# Patient Record
Sex: Female | Born: 1955
Health system: Southern US, Community
[De-identification: ages and names within clinical notes are randomized; demographics above are authoritative.]

## PROBLEM LIST (undated history)

## (undated) DIAGNOSIS — F32A Depression, unspecified: Secondary | ICD-10-CM

## (undated) DIAGNOSIS — F419 Anxiety disorder, unspecified: Secondary | ICD-10-CM

## (undated) DIAGNOSIS — T7840XA Allergy, unspecified, initial encounter: Secondary | ICD-10-CM

## (undated) DIAGNOSIS — Z780 Asymptomatic menopausal state: Secondary | ICD-10-CM

## (undated) DIAGNOSIS — F329 Major depressive disorder, single episode, unspecified: Secondary | ICD-10-CM

## (undated) DIAGNOSIS — G43909 Migraine, unspecified, not intractable, without status migrainosus: Secondary | ICD-10-CM

## (undated) DIAGNOSIS — E079 Disorder of thyroid, unspecified: Secondary | ICD-10-CM

## (undated) HISTORY — DX: Asymptomatic menopausal state: Z78.0

## (undated) HISTORY — DX: Anxiety disorder, unspecified: F41.9

## (undated) HISTORY — DX: Migraine, unspecified, not intractable, without status migrainosus: G43.909

## (undated) HISTORY — PX: BREAST SURGERY: SHX581

## (undated) HISTORY — DX: Allergy, unspecified, initial encounter: T78.40XA

## (undated) HISTORY — DX: Depression, unspecified: F32.A

## (undated) HISTORY — DX: Major depressive disorder, single episode, unspecified: F32.9

---

## 1991-06-20 HISTORY — PX: TUBAL LIGATION: SHX77

## 1998-01-14 ENCOUNTER — Other Ambulatory Visit: Admission: RE | Admit: 1998-01-14 | Discharge: 1998-01-14 | Payer: Self-pay | Admitting: Gynecology

## 1998-05-25 ENCOUNTER — Ambulatory Visit (HOSPITAL_COMMUNITY): Admission: RE | Admit: 1998-05-25 | Discharge: 1998-05-25 | Payer: Self-pay

## 1999-02-24 ENCOUNTER — Other Ambulatory Visit: Admission: RE | Admit: 1999-02-24 | Discharge: 1999-02-24 | Payer: Self-pay | Admitting: Gynecology

## 2001-10-14 ENCOUNTER — Other Ambulatory Visit: Admission: RE | Admit: 2001-10-14 | Discharge: 2001-10-14 | Payer: Self-pay | Admitting: Obstetrics and Gynecology

## 2002-06-19 DIAGNOSIS — Z780 Asymptomatic menopausal state: Secondary | ICD-10-CM

## 2002-06-19 HISTORY — DX: Asymptomatic menopausal state: Z78.0

## 2003-08-13 ENCOUNTER — Other Ambulatory Visit: Admission: RE | Admit: 2003-08-13 | Discharge: 2003-08-13 | Payer: Self-pay | Admitting: Obstetrics and Gynecology

## 2004-08-25 ENCOUNTER — Other Ambulatory Visit: Admission: RE | Admit: 2004-08-25 | Discharge: 2004-08-25 | Payer: Self-pay | Admitting: Obstetrics and Gynecology

## 2005-03-02 ENCOUNTER — Ambulatory Visit (HOSPITAL_BASED_OUTPATIENT_CLINIC_OR_DEPARTMENT_OTHER): Admission: RE | Admit: 2005-03-02 | Discharge: 2005-03-02 | Payer: Self-pay | Admitting: Plastic Surgery

## 2005-03-02 ENCOUNTER — Ambulatory Visit (HOSPITAL_COMMUNITY): Admission: RE | Admit: 2005-03-02 | Discharge: 2005-03-02 | Payer: Self-pay | Admitting: Plastic Surgery

## 2006-06-19 DIAGNOSIS — G43909 Migraine, unspecified, not intractable, without status migrainosus: Secondary | ICD-10-CM

## 2006-06-19 HISTORY — DX: Migraine, unspecified, not intractable, without status migrainosus: G43.909

## 2011-06-22 ENCOUNTER — Ambulatory Visit: Payer: Self-pay

## 2011-06-22 DIAGNOSIS — G43709 Chronic migraine without aura, not intractable, without status migrainosus: Secondary | ICD-10-CM

## 2011-08-03 ENCOUNTER — Telehealth: Payer: Self-pay

## 2011-08-03 NOTE — Telephone Encounter (Signed)
Pt states saw dr Hal Hope for migraines. States the rx for her is not working - she feels the dose is not strong enough or the type is not working. States she can't afford to rtc currently and would like to know if the rx can be changed in some way to help her.  Best: [phone]   [facility] [address]

## 2011-08-04 NOTE — Telephone Encounter (Signed)
Patient was given on June 22, 2011- Imitrex 50mg  1 po prn migraine, but not helping.

## 2011-08-04 NOTE — Telephone Encounter (Signed)
Explained provider message to pt who stated she can't afford a CT and is not having persistent HAs. Doesn't feel that she should have to do that to try a different medication. Gave pt information about [place] financial aid, but pt didn't want the #, stating she was very disappointed with [place] and would try to get other meds from former MD.

## 2011-08-04 NOTE — Telephone Encounter (Signed)
Needs to RTC for eval.  Pt was encouraged to have CT at last OV for headache.  If pain is persisting despite medication needs to RTC for eval and treatment and possibly RF for CT.

## 2011-11-27 ENCOUNTER — Ambulatory Visit: Payer: Self-pay | Admitting: Physician Assistant

## 2011-11-27 VITALS — BP 122/78 | HR 73 | Temp 98.0°F | Resp 16 | Ht 63.5 in | Wt 119.8 lb

## 2011-11-27 DIAGNOSIS — L821 Other seborrheic keratosis: Secondary | ICD-10-CM

## 2011-11-27 NOTE — Progress Notes (Signed)
  Subjective:    Patient ID: Debra Hunter, female    DOB: 15-Dec-1955, 56 y.o.   MRN: [id number]  HPI  Presents for evaluation of a lesion on her back that she just noticed today.  No pain.  Review of Systems     Objective:   Physical Exam  4 mm x 6 mm hyperpigmented, hyperkeratotic "stuck-on" appearing plaque left mid-back.  Several smaller similar lesions noted scattered on the back.      Assessment & Plan:   1. Seborrheic keratosis    Re-assurance.  Encouraged to obtain health maintenance.  Needs breast exam, mammogram, and pap. Patient is uninsured.  She will see what arrangements she can make.

## 2011-11-27 NOTE — Patient Instructions (Signed)
[facility] Financial Aid Office [phone].  If you have difficulties, call us [phone]) and ask for Okey Regal at ext [phone]

## 2012-04-12 ENCOUNTER — Ambulatory Visit: Payer: Self-pay | Admitting: Family Medicine

## 2012-04-12 VITALS — BP 116/80 | HR 69 | Temp 97.7°F | Resp 16 | Ht 65.0 in | Wt 118.4 lb

## 2012-04-12 DIAGNOSIS — G43909 Migraine, unspecified, not intractable, without status migrainosus: Secondary | ICD-10-CM

## 2012-04-12 DIAGNOSIS — G43709 Chronic migraine without aura, not intractable, without status migrainosus: Secondary | ICD-10-CM

## 2012-04-12 MED ORDER — SUMATRIPTAN SUCCINATE 100 MG PO TABS: 100.0000 mg | ORAL_TABLET | ORAL | Status: AC | PRN

## 2012-04-12 NOTE — Assessment & Plan Note (Signed)
New to this provider.  Counseled extensively during visit regarding prevention, triggers.  Rx for Imitrex 100mg  provided and counseled on use. Advised to also continue Magnesium supplementation. Normal neurological exam; no concerning features to suggest other etiology.  Symptoms classic for migraine.

## 2012-04-12 NOTE — Progress Notes (Signed)
[address]   [place], [place]  [address]   [phone]  Subjective:    Patient ID: Debra Hunter, female    DOB: 1955-09-11, 56 y.o.   MRN: [id number]  HPIThis 56 y.o. female presents for evaluation of migraine headaches. Has been taking friend's medication; has taken friend's Imitrex, Maxalt.  Onset of migraines three years ago with menopause.  LMP age 54.  Always the same situation; occurs with major stressful situations.  Will start medicating with Ibuprofen 800mg , 3 sudafeds with relief sometimes; sometimes it will not help.  Draining headache.HA is different a lot of times.  Bends down a lot with work; location occipital region.  If does radiate to frontal region, usually is unilateral.  Pressure sensation in back.  Stretching neck and massage helps.  +nausea severe but rare vomiting.  +photophobia; +phonophobia. Usually must go to bed.  Must leave work and shut down.  Severity 8/10.  Has 1 per month.  Missing a lot of work.  If drinks any alcohol especially wine.   Beer rarely has effect.  No blurred vision, diplopia.  No dizziness; no n/t/w.  No unsteadiness.  No nighttime awakening.  No chronic neck pain.  Sister with migraines menstrually related.  Has taken Imitrex a lot with improvement; works within 1/2 hour.  Maxalt even more effective.  Found a drug rep who gave sample of Maxalt MLT.  No exertional headaches; not related to running.  No headaches after running.  Regular sleep schedule a bit disrupted in past few years.  No coital headaches.  PMH: none Psurg:see list Social:  Works a lot; works for a [employer] houses also; no tobacco; no drugs.  Alcohol rarely now due to headaches.  Exercises a lot 4 days per week 4 miles.   Review of Systems  Constitutional: Negative for fever, chills, diaphoresis, activity change, appetite change and fatigue.  HENT: Negative for nosebleeds, congestion, rhinorrhea, sneezing and postnasal drip.   Eyes: Positive for photophobia. Negative  for visual disturbance.  Respiratory: Negative for cough, shortness of breath, wheezing and stridor.   Cardiovascular: Negative for chest pain, palpitations and leg swelling.  Neurological: Positive for headaches. Negative for dizziness, tremors, seizures, syncope, facial asymmetry, speech difficulty, weakness, light-headedness and numbness.  Psychiatric/Behavioral: Positive for disturbed wake/sleep cycle. Negative for hallucinations, dysphoric mood and decreased concentration. The patient is not nervous/anxious and is not hyperactive.         Past Medical History  Diagnosis Date  . Allergy     PAST, PER PT DOES NOT HAVE ANYMORE  . Depression   . Anxiety   . Menopause 06/19/2002    age 79.  . Migraine headache 06/19/2006    One headache monthly.  One sided, nausea, photophobia.    Past Surgical History  Procedure Date  . Breast surgery 13 YRS AGO    LEFT REMOVE CYST  . Cesarean section 1993  . Tubal ligation 1993    AFTER DELIVERY    Prior to Admission medications   Medication Sig Start Date End Date Taking? Authorizing Provider  b complex vitamins tablet Take 1 tablet by mouth daily.   Yes Historical Provider, MD  Cholecalciferol (VITAMIN D) 1000 UNITS capsule Take 1,000 Units by mouth daily.   Yes Historical Provider, MD  Providence Lanius CAPS Take by mouth daily.   Yes Historical Provider, MD  Magnesium 500 MG TABS Take 500 mg by mouth daily.   Yes Historical Provider, MD  selenium 50 MCG  TABS Take 50 mcg by mouth daily.   Yes Historical Provider, MD  VITAMIN E PO Take by mouth as needed.   Yes Historical Provider, MD  SUMAtriptan (IMITREX) 100 MG tablet Take 1 tablet (100 mg total) by mouth every 2 (two) hours as needed for migraine. Do not exceed two tablets in 24 hour period. 04/12/12   Ethelda Chick, MD    No Known Allergies  History   Social History  . Marital Status: Married    Spouse Name: N/A    Number of Children: N/A  . Years of Education: 4   Occupational  History  . CATERER    Social History Main Topics  . Smoking status: Never Smoker   . Smokeless tobacco: Never Used  . Alcohol Use: 3.6 oz/week    6 Glasses of wine per week     WINE 1-2 GLASSES/WEEK  . Drug Use: No  . Sexually Active: Yes     HAD TUBAL LIGATION, SEX PARTNERS 1 IN 12 MONTHS   Other Topics Concern  . Not on file   Social History Narrative   Marital status: divorced.   Children: 1 daughter, 1 son.   Employment: works for Sales promotion account executive; also [employer].   Tobacco: never   Alcohol: 2 glasses of wine or beer three times per week.   Drugs: none   EXERCISE - RUN 4 TIMES/WEEK 4 MILES FOR 35 MINUTES   Sexual activity: sexually active; one partner in past year.    Family History  Problem Relation Age of Onset  . COPD Mother 75  . Heart disease Father     Objective:   Physical Exam  Nursing note and vitals reviewed. Constitutional: She is oriented to person, place, and time. She appears well-developed and well-nourished. No distress.  HENT:  Head: Normocephalic and atraumatic.  Right Ear: External ear normal.  Left Ear: External ear normal.  Nose: Nose normal.  Mouth/Throat: Oropharynx is clear and moist.  Eyes: Conjunctivae normal and EOM are normal. Pupils are equal, round, and reactive to light.  Neck: Normal range of motion. Neck supple. No thyromegaly present.  Cardiovascular: Normal rate, regular rhythm, normal heart sounds and intact distal pulses.  Exam reveals no gallop and no friction rub.   No murmur heard. Pulmonary/Chest: Effort normal and breath sounds normal. No respiratory distress. She has no wheezes. She has no rales.  Musculoskeletal:       Cervical back: She exhibits normal range of motion, no tenderness and no bony tenderness.  Lymphadenopathy:    She has no cervical adenopathy.  Neurological: She is alert and oriented to person, place, and time. She has normal reflexes. No cranial nerve deficit. She exhibits normal muscle tone. Coordination  normal.  Skin: Skin is warm and dry. She is not diaphoretic.  Psychiatric: She has a normal mood and affect. Her behavior is normal. Judgment and thought content normal.       Assessment & Plan:   1. Migraine headache  SUMAtriptan (IMITREX) 100 MG tablet   Meds ordered this encounter  Medications  . selenium 50 MCG TABS    Sig: Take 50 mcg by mouth daily.  . Cholecalciferol (VITAMIN D) 1000 UNITS capsule    Sig: Take 1,000 Units by mouth daily.  . Magnesium 500 MG TABS    Sig: Take 500 mg by mouth daily.  Providence Lanius CAPS    Sig: Take by mouth daily.  Marland Kitchen b complex vitamins tablet    Sig:  Take 1 tablet by mouth daily.  Marland Kitchen VITAMIN E PO    Sig: Take by mouth as needed.  . SUMAtriptan (IMITREX) 100 MG tablet    Sig: Take 1 tablet (100 mg total) by mouth every 2 (two) hours as needed for migraine. Do not exceed two tablets in 24 hour period.    Dispense:  8 tablet    Refill:  3

## 2012-04-12 NOTE — Patient Instructions (Signed)
1. Migraine headache  SUMAtriptan (IMITREX) 100 MG tablet   Migraine Headache A migraine headache is an intense, throbbing pain on one or both sides of your head. A migraine can last for 30 minutes to several hours. CAUSES  The exact cause of a migraine headache is not always known. However, a migraine may be caused when nerves in the brain become irritated and release chemicals that cause inflammation. This causes pain. SYMPTOMS  Pain on one or both sides of your head.  Pulsating or throbbing pain.  Severe pain that prevents daily activities.  Pain that is aggravated by any physical activity.  Nausea, vomiting, or both.  Dizziness.  Pain with exposure to bright lights, loud noises, or activity.  General sensitivity to bright lights, loud noises, or smells. Before you get a migraine, you may get warning signs that a migraine is coming (aura). An aura may include:  Seeing flashing lights.  Seeing bright spots, halos, or zig-zag lines.  Having tunnel vision or blurred vision.  Having feelings of numbness or tingling.  Having trouble talking.  Having muscle weakness. MIGRAINE TRIGGERS  Alcohol.  Smoking.  Stress.  Menstruation.  Aged cheeses.  Foods or drinks that contain nitrates, glutamate, aspartame, or tyramine.  Lack of sleep.  Chocolate.  Caffeine.  Hunger.  Physical exertion.  Fatigue.  Medicines used to treat chest pain (nitroglycerine), birth control pills, estrogen, and some blood pressure medicines. DIAGNOSIS  A migraine headache is often diagnosed based on:  Symptoms.  Physical examination.  A CT scan or MRI of your head. TREATMENT Medicines may be given for pain and nausea. Medicines can also be given to help prevent recurrent migraines.  HOME CARE INSTRUCTIONS  Only take over-the-counter or prescription medicines for pain or discomfort as directed by your caregiver. The use of long-term narcotics is not recommended.  Lie down in  a dark, quiet room when you have a migraine.  Keep a journal to find out what may trigger your migraine headaches. For example, write down:  What you eat and drink.  How much sleep you get.  Any change to your diet or medicines.  Limit alcohol consumption.  Quit smoking if you smoke.  Get 7 to 9 hours of sleep, or as recommended by your caregiver.  Limit stress.  Keep lights dim if bright lights bother you and make your migraines worse. SEEK IMMEDIATE MEDICAL CARE IF:   Your migraine becomes severe.  You have a fever.  You have a stiff neck.  You have vision loss.  You have muscular weakness or loss of muscle control.  You start losing your balance or have trouble walking.  You feel faint or pass out.  You have severe symptoms that are different from your first symptoms. MAKE SURE YOU:   Understand these instructions.  Will watch your condition.  Will get help right away if you are not doing well or get worse. Document Released: 06/05/2005 Document Revised: 08/28/2011 Document Reviewed: 05/26/2011 [facility] Patient Information 2013 [place], [place].

## 2012-09-26 ENCOUNTER — Other Ambulatory Visit: Payer: Self-pay | Admitting: Obstetrics and Gynecology

## 2013-12-08 ENCOUNTER — Other Ambulatory Visit: Payer: Self-pay | Admitting: Obstetrics and Gynecology

## 2013-12-09 LAB — CYTOLOGY - PAP

## 2014-04-22 ENCOUNTER — Telehealth: Payer: Self-pay | Admitting: *Deleted

## 2014-04-22 NOTE — Telephone Encounter (Signed)
Received medical records via fax from [facility]. Records forwarded to [facility]. JG//[id number]

## 2014-10-15 ENCOUNTER — Telehealth: Payer: Self-pay | Admitting: *Deleted

## 2014-10-15 NOTE — Telephone Encounter (Signed)
Spoke with pt regarding her pre visit. Pt states did not know she was scheduled to see Dr.Blyth nor wanting to be seen at this practice.Marland KitchenMarland Kitchen

## 2014-10-16 ENCOUNTER — Ambulatory Visit: Payer: Self-pay | Admitting: Family Medicine

## 2015-01-18 ENCOUNTER — Other Ambulatory Visit: Payer: Self-pay | Admitting: Obstetrics and Gynecology

## 2015-01-19 LAB — CYTOLOGY - PAP

## 2015-10-21 DIAGNOSIS — F900 Attention-deficit hyperactivity disorder, predominantly inattentive type: Secondary | ICD-10-CM | POA: Diagnosis not present

## 2015-10-21 DIAGNOSIS — G43909 Migraine, unspecified, not intractable, without status migrainosus: Secondary | ICD-10-CM | POA: Diagnosis not present

## 2015-10-21 DIAGNOSIS — E039 Hypothyroidism, unspecified: Secondary | ICD-10-CM | POA: Diagnosis not present

## 2016-03-27 DIAGNOSIS — Z01419 Encounter for gynecological examination (general) (routine) without abnormal findings: Secondary | ICD-10-CM | POA: Diagnosis not present

## 2016-03-27 DIAGNOSIS — Z6820 Body mass index (BMI) 20.0-20.9, adult: Secondary | ICD-10-CM | POA: Diagnosis not present

## 2016-03-27 DIAGNOSIS — Z1231 Encounter for screening mammogram for malignant neoplasm of breast: Secondary | ICD-10-CM | POA: Diagnosis not present

## 2016-03-27 DIAGNOSIS — Z1212 Encounter for screening for malignant neoplasm of rectum: Secondary | ICD-10-CM | POA: Diagnosis not present

## 2016-05-18 DIAGNOSIS — J069 Acute upper respiratory infection, unspecified: Secondary | ICD-10-CM | POA: Diagnosis not present

## 2016-05-18 DIAGNOSIS — R05 Cough: Secondary | ICD-10-CM | POA: Diagnosis not present

## 2016-05-18 DIAGNOSIS — R0789 Other chest pain: Secondary | ICD-10-CM | POA: Diagnosis not present

## 2016-05-25 DIAGNOSIS — Z131 Encounter for screening for diabetes mellitus: Secondary | ICD-10-CM | POA: Diagnosis not present

## 2016-05-25 DIAGNOSIS — Z118 Encounter for screening for other infectious and parasitic diseases: Secondary | ICD-10-CM | POA: Diagnosis not present

## 2016-05-25 DIAGNOSIS — Z130 Encounter for screening for diseases of the blood and blood-forming organs and certain disorders involving the immune mechanism: Secondary | ICD-10-CM | POA: Diagnosis not present

## 2016-05-25 DIAGNOSIS — Z13228 Encounter for screening for other metabolic disorders: Secondary | ICD-10-CM | POA: Diagnosis not present

## 2016-07-20 DIAGNOSIS — E0590 Thyrotoxicosis, unspecified without thyrotoxic crisis or storm: Secondary | ICD-10-CM | POA: Diagnosis not present

## 2016-09-07 DIAGNOSIS — R05 Cough: Secondary | ICD-10-CM | POA: Diagnosis not present

## 2016-09-07 DIAGNOSIS — E559 Vitamin D deficiency, unspecified: Secondary | ICD-10-CM | POA: Diagnosis not present

## 2016-09-07 DIAGNOSIS — E039 Hypothyroidism, unspecified: Secondary | ICD-10-CM | POA: Diagnosis not present

## 2016-09-07 DIAGNOSIS — G43909 Migraine, unspecified, not intractable, without status migrainosus: Secondary | ICD-10-CM | POA: Diagnosis not present

## 2016-09-07 DIAGNOSIS — Z0000 Encounter for general adult medical examination without abnormal findings: Secondary | ICD-10-CM | POA: Diagnosis not present

## 2016-09-18 ENCOUNTER — Other Ambulatory Visit (HOSPITAL_COMMUNITY): Payer: Self-pay | Admitting: Respiratory Therapy

## 2016-09-18 DIAGNOSIS — J449 Chronic obstructive pulmonary disease, unspecified: Secondary | ICD-10-CM

## 2016-09-29 ENCOUNTER — Ambulatory Visit (HOSPITAL_COMMUNITY)
Admission: RE | Admit: 2016-09-29 | Discharge: 2016-09-29 | Disposition: A | Discharge status: Home / Self Care | Payer: BLUE CROSS/BLUE SHIELD | Source: Ambulatory Visit | Attending: Internal Medicine | Admitting: Internal Medicine

## 2016-09-29 DIAGNOSIS — J449 Chronic obstructive pulmonary disease, unspecified: Secondary | ICD-10-CM | POA: Diagnosis not present

## 2016-09-29 LAB — PULMONARY FUNCTION TEST
DL/VA % PRED: 93 %
DL/VA: 4.38 ml/min/mmHg/L
DLCO unc % pred: 82 %
DLCO unc: 18.91 ml/min/mmHg
FEF 25-75 PRE: 2.03 L/s
FEF2575-%PRED-PRE: 91 %
FEV1-%PRED-PRE: 100 %
FEV1-PRE: 2.42 L
FEV1FVC-%PRED-PRE: 99 %
FEV6-%Pred-Pre: 102 %
FEV6-PRE: 3.11 L
FEV6FVC-%Pred-Pre: 104 %
FVC-%Pred-Pre: 99 %
FVC-PRE: 3.11 L
Pre FEV1/FVC ratio: 78 %
Pre FEV6/FVC Ratio: 100 %

## 2016-10-03 DIAGNOSIS — E039 Hypothyroidism, unspecified: Secondary | ICD-10-CM | POA: Diagnosis not present

## 2016-10-05 DIAGNOSIS — E039 Hypothyroidism, unspecified: Secondary | ICD-10-CM | POA: Diagnosis not present

## 2016-12-04 DIAGNOSIS — E039 Hypothyroidism, unspecified: Secondary | ICD-10-CM | POA: Diagnosis not present

## 2017-04-23 DIAGNOSIS — Z681 Body mass index (BMI) 19 or less, adult: Secondary | ICD-10-CM | POA: Diagnosis not present

## 2017-04-23 DIAGNOSIS — Z01419 Encounter for gynecological examination (general) (routine) without abnormal findings: Secondary | ICD-10-CM | POA: Diagnosis not present

## 2017-04-23 DIAGNOSIS — Z1231 Encounter for screening mammogram for malignant neoplasm of breast: Secondary | ICD-10-CM | POA: Diagnosis not present

## 2017-06-21 DIAGNOSIS — F900 Attention-deficit hyperactivity disorder, predominantly inattentive type: Secondary | ICD-10-CM | POA: Diagnosis not present

## 2017-06-21 DIAGNOSIS — E039 Hypothyroidism, unspecified: Secondary | ICD-10-CM | POA: Diagnosis not present

## 2017-06-21 DIAGNOSIS — G43909 Migraine, unspecified, not intractable, without status migrainosus: Secondary | ICD-10-CM | POA: Diagnosis not present

## 2017-07-09 DIAGNOSIS — E039 Hypothyroidism, unspecified: Secondary | ICD-10-CM | POA: Diagnosis not present

## 2017-08-30 DIAGNOSIS — F900 Attention-deficit hyperactivity disorder, predominantly inattentive type: Secondary | ICD-10-CM | POA: Diagnosis not present

## 2017-08-30 DIAGNOSIS — E559 Vitamin D deficiency, unspecified: Secondary | ICD-10-CM | POA: Diagnosis not present

## 2017-08-30 DIAGNOSIS — E538 Deficiency of other specified B group vitamins: Secondary | ICD-10-CM | POA: Diagnosis not present

## 2017-08-30 DIAGNOSIS — Z0000 Encounter for general adult medical examination without abnormal findings: Secondary | ICD-10-CM | POA: Diagnosis not present

## 2017-08-30 DIAGNOSIS — N390 Urinary tract infection, site not specified: Secondary | ICD-10-CM | POA: Diagnosis not present

## 2017-08-30 DIAGNOSIS — E039 Hypothyroidism, unspecified: Secondary | ICD-10-CM | POA: Diagnosis not present

## 2017-08-30 DIAGNOSIS — Z13220 Encounter for screening for lipoid disorders: Secondary | ICD-10-CM | POA: Diagnosis not present

## 2017-09-13 DIAGNOSIS — Z780 Asymptomatic menopausal state: Secondary | ICD-10-CM | POA: Diagnosis not present

## 2017-09-13 DIAGNOSIS — M8580 Other specified disorders of bone density and structure, unspecified site: Secondary | ICD-10-CM | POA: Diagnosis not present

## 2017-09-13 DIAGNOSIS — Z0000 Encounter for general adult medical examination without abnormal findings: Secondary | ICD-10-CM | POA: Diagnosis not present

## 2017-09-13 DIAGNOSIS — Z23 Encounter for immunization: Secondary | ICD-10-CM | POA: Diagnosis not present

## 2017-12-27 DIAGNOSIS — G43909 Migraine, unspecified, not intractable, without status migrainosus: Secondary | ICD-10-CM | POA: Diagnosis not present

## 2017-12-27 DIAGNOSIS — F900 Attention-deficit hyperactivity disorder, predominantly inattentive type: Secondary | ICD-10-CM | POA: Diagnosis not present

## 2017-12-27 DIAGNOSIS — F411 Generalized anxiety disorder: Secondary | ICD-10-CM | POA: Diagnosis not present

## 2017-12-27 DIAGNOSIS — E039 Hypothyroidism, unspecified: Secondary | ICD-10-CM | POA: Diagnosis not present

## 2018-03-14 DIAGNOSIS — F900 Attention-deficit hyperactivity disorder, predominantly inattentive type: Secondary | ICD-10-CM | POA: Diagnosis not present

## 2018-06-28 DIAGNOSIS — Z5181 Encounter for therapeutic drug level monitoring: Secondary | ICD-10-CM | POA: Diagnosis not present

## 2018-07-02 DIAGNOSIS — Z5181 Encounter for therapeutic drug level monitoring: Secondary | ICD-10-CM | POA: Diagnosis not present

## 2018-09-03 DIAGNOSIS — L820 Inflamed seborrheic keratosis: Secondary | ICD-10-CM | POA: Diagnosis not present

## 2018-09-03 DIAGNOSIS — L821 Other seborrheic keratosis: Secondary | ICD-10-CM | POA: Diagnosis not present

## 2018-09-12 DIAGNOSIS — Z79899 Other long term (current) drug therapy: Secondary | ICD-10-CM | POA: Diagnosis not present

## 2018-09-24 DIAGNOSIS — E039 Hypothyroidism, unspecified: Secondary | ICD-10-CM | POA: Diagnosis not present

## 2018-09-24 DIAGNOSIS — E7800 Pure hypercholesterolemia, unspecified: Secondary | ICD-10-CM | POA: Diagnosis not present

## 2018-10-22 DIAGNOSIS — N952 Postmenopausal atrophic vaginitis: Secondary | ICD-10-CM | POA: Diagnosis not present

## 2018-10-22 DIAGNOSIS — N941 Unspecified dyspareunia: Secondary | ICD-10-CM | POA: Diagnosis not present

## 2018-11-25 DIAGNOSIS — E7800 Pure hypercholesterolemia, unspecified: Secondary | ICD-10-CM | POA: Diagnosis not present

## 2018-11-25 DIAGNOSIS — E039 Hypothyroidism, unspecified: Secondary | ICD-10-CM | POA: Diagnosis not present

## 2018-11-28 DIAGNOSIS — Z0000 Encounter for general adult medical examination without abnormal findings: Secondary | ICD-10-CM | POA: Diagnosis not present

## 2019-01-02 DIAGNOSIS — Z1231 Encounter for screening mammogram for malignant neoplasm of breast: Secondary | ICD-10-CM | POA: Diagnosis not present

## 2019-01-02 DIAGNOSIS — Z01419 Encounter for gynecological examination (general) (routine) without abnormal findings: Secondary | ICD-10-CM | POA: Diagnosis not present

## 2019-01-02 DIAGNOSIS — Z6821 Body mass index (BMI) 21.0-21.9, adult: Secondary | ICD-10-CM | POA: Diagnosis not present

## 2019-04-16 DIAGNOSIS — G43909 Migraine, unspecified, not intractable, without status migrainosus: Secondary | ICD-10-CM | POA: Diagnosis not present

## 2019-04-16 DIAGNOSIS — Z7189 Other specified counseling: Secondary | ICD-10-CM | POA: Diagnosis not present

## 2019-04-16 DIAGNOSIS — F900 Attention-deficit hyperactivity disorder, predominantly inattentive type: Secondary | ICD-10-CM | POA: Diagnosis not present

## 2019-06-16 DIAGNOSIS — E039 Hypothyroidism, unspecified: Secondary | ICD-10-CM | POA: Diagnosis not present

## 2019-08-17 ENCOUNTER — Ambulatory Visit: Payer: Self-pay | Attending: Internal Medicine

## 2019-08-17 DIAGNOSIS — Z23 Encounter for immunization: Secondary | ICD-10-CM | POA: Insufficient documentation

## 2019-08-17 NOTE — Progress Notes (Signed)
   Covid-19 Vaccination Clinic  Name:  ALIXIS KUTCH    MRN: [id number] DOB: [birth year]  08/17/2019  Ms. Helmes was observed post Covid-19 immunization for 15 minutes without incidence. She was provided with Vaccine Information Sheet and instruction to access the V-Safe system.   Ms. Marotz was instructed to call 911 with any severe reactions post vaccine: Marland Kitchen Difficulty breathing  . Swelling of your face and throat  . A fast heartbeat  . A bad rash all over your body  . Dizziness and weakness    Immunizations Administered    Name Date Dose VIS Date Route   Pfizer COVID-19 Vaccine 08/17/2019  3:40 PM 0.3 mL 05/30/2019 Intramuscular   Manufacturer: [place]   Lot: [id number]   [place]: [id number]

## 2019-09-16 ENCOUNTER — Ambulatory Visit: Payer: Self-pay | Attending: Internal Medicine

## 2019-09-16 DIAGNOSIS — Z23 Encounter for immunization: Secondary | ICD-10-CM

## 2019-09-16 NOTE — Progress Notes (Signed)
   Covid-19 Vaccination Clinic  Name:  Debra Hunter    MRN: [id number] DOB: 1956/05/19  09/16/2019  [name] was observed post Covid-19 immunization for 15 minutes without incident. She was provided with Vaccine Information Sheet and instruction to access the V-Safe system.   [name] was instructed to call 911 with any severe reactions post vaccine: Marland Kitchen Difficulty breathing  . Swelling of face and throat  . A fast heartbeat  . A bad rash all over body  . Dizziness and weakness   Immunizations Administered    Name Date Dose VIS Date Route   Pfizer COVID-19 Vaccine 09/16/2019  4:38 PM 0.3 mL 05/30/2019 Intramuscular   Manufacturer: [place]   Lot: [id number]   [place]: [id number]

## 2020-05-15 ENCOUNTER — Encounter (HOSPITAL_COMMUNITY): Payer: Self-pay | Admitting: Emergency Medicine

## 2020-05-15 ENCOUNTER — Other Ambulatory Visit: Payer: Self-pay

## 2020-05-15 ENCOUNTER — Emergency Department (HOSPITAL_COMMUNITY)
Admission: EM | Admit: 2020-05-15 | Discharge: 2020-05-15 | Disposition: A | Discharge status: Home / Self Care | Payer: PPO | Attending: Emergency Medicine | Admitting: Emergency Medicine

## 2020-05-15 ENCOUNTER — Emergency Department (HOSPITAL_COMMUNITY): Payer: PPO

## 2020-05-15 DIAGNOSIS — E079 Disorder of thyroid, unspecified: Secondary | ICD-10-CM | POA: Diagnosis not present

## 2020-05-15 DIAGNOSIS — Y9302 Activity, running: Secondary | ICD-10-CM | POA: Insufficient documentation

## 2020-05-15 DIAGNOSIS — S61432A Puncture wound without foreign body of left hand, initial encounter: Secondary | ICD-10-CM | POA: Insufficient documentation

## 2020-05-15 DIAGNOSIS — Z203 Contact with and (suspected) exposure to rabies: Secondary | ICD-10-CM | POA: Diagnosis not present

## 2020-05-15 DIAGNOSIS — W540XXA Bitten by dog, initial encounter: Secondary | ICD-10-CM | POA: Insufficient documentation

## 2020-05-15 DIAGNOSIS — Z2914 Encounter for prophylactic rabies immune globin: Secondary | ICD-10-CM | POA: Diagnosis not present

## 2020-05-15 DIAGNOSIS — S61452A Open bite of left hand, initial encounter: Secondary | ICD-10-CM | POA: Diagnosis not present

## 2020-05-15 DIAGNOSIS — Z23 Encounter for immunization: Secondary | ICD-10-CM | POA: Diagnosis not present

## 2020-05-15 DIAGNOSIS — S6992XA Unspecified injury of left wrist, hand and finger(s), initial encounter: Secondary | ICD-10-CM | POA: Diagnosis present

## 2020-05-15 HISTORY — DX: Disorder of thyroid, unspecified: E07.9

## 2020-05-15 MED ORDER — RABIES VACCINE, PCEC IM SUSR
1.0000 mL | Freq: Once | INTRAMUSCULAR | Status: AC
  Administered 2020-05-15: 1 mL via INTRAMUSCULAR
  Filled 2020-05-15: qty 1

## 2020-05-15 MED ORDER — AMOXICILLIN-POT CLAVULANATE 875-125 MG PO TABS: 1.0000 | ORAL_TABLET | Freq: Two times a day (BID) | ORAL | 0 refills | Status: AC

## 2020-05-15 MED ORDER — RABIES IMMUNE GLOBULIN 150 UNIT/ML IM INJ
20.0000 [IU]/kg | INJECTION | Freq: Once | INTRAMUSCULAR | Status: AC
  Administered 2020-05-15: 1125 [IU] via INTRAMUSCULAR
  Filled 2020-05-15: qty 8

## 2020-05-15 MED ORDER — TETANUS-DIPHTH-ACELL PERTUSSIS 5-2.5-18.5 LF-MCG/0.5 IM SUSY
0.5000 mL | PREFILLED_SYRINGE | Freq: Once | INTRAMUSCULAR | Status: AC
  Administered 2020-05-15: 0.5 mL via INTRAMUSCULAR
  Filled 2020-05-15: qty 0.5

## 2020-05-15 NOTE — ED Provider Notes (Signed)
[facility] DEPARTMENT Provider Note   CSN: [id number] Arrival date & time: 05/15/20  0957     History Chief Complaint  Patient presents with   Animal Bite    Debra Hunter is a 64 y.o. female who presents to the ED today with complaint of animal bite to left hand that occurred just PTA. Pt states that she was jogging earlier today in her neighborhood when she heard something behind her. She noticed a Secondary school teacher mix and then he proceeded to bite her on her left hand. Pt was wearing gloves as it was cold outside however when she removed her gloved she did have a break in the skin. She reports a neighbor told her he had seen the dog around however never called animal control; the dog does appear to be a stray without a collar with unknown rabies status. Pt is unsure regarding her tetanus status as well. She complains of mild pain to the area however states she otherwise feels fine.   The history is provided by the patient and medical records.       Past Medical History:  Diagnosis Date   Allergy    PAST, PER PT DOES NOT HAVE ANYMORE   Anxiety    Depression    Menopause 06/19/2002   age 25.   Migraine headache 06/19/2006   One headache monthly.  One sided, nausea, photophobia.   Thyroid disease     Patient Active Problem List   Diagnosis Date Noted   Migraine headache 04/12/2012    Past Surgical History:  Procedure Laterality Date   BREAST SURGERY  13 YRS AGO   LEFT REMOVE CYST   CESAREAN [place]   AFTER DELIVERY     OB History   No obstetric history on file.     Family History  Problem Relation Age of Onset   COPD Mother 40   Heart disease Father    Migraines Sister     Social History   Tobacco Use   Smoking status: Never Smoker   Smokeless tobacco: Never Used  Substance Use Topics   Alcohol use: Yes    Alcohol/week: 6.0 standard drinks    Types: 6 Glasses of wine per week     Comment: WINE 1-2 GLASSES/WEEK   Drug use: No    Home Medications Prior to Admission medications   Medication Sig Start Date End Date Taking? Authorizing Provider  amoxicillin-clavulanate (AUGMENTIN) 875-125 MG tablet Take 1 tablet by mouth every 12 (twelve) hours. 05/15/20   Eustaquio Maize, PA-C  b complex vitamins tablet Take 1 tablet by mouth daily.    [provider]  Cholecalciferol (VITAMIN D) 1000 UNITS capsule Take 1,000 Units by mouth daily.    [provider]  Astrid Drafts CAPS Take by mouth daily.    [provider]  Magnesium 500 MG TABS Take 500 mg by mouth daily.    [provider]  selenium 50 MCG TABS Take 50 mcg by mouth daily.    [provider]  SUMAtriptan (IMITREX) 100 MG tablet Take 1 tablet (100 mg total) by mouth every 2 (two) hours as needed for migraine. Do not exceed two tablets in 24 hour period. 04/12/12   Wardell Honour, MD  VITAMIN E PO Take by mouth as needed.    [provider]    Allergies    Patient has no known allergies.  Review of Systems  Review of Systems  Constitutional: Negative for chills and fever.  Musculoskeletal: Positive for arthralgias.  Skin: Positive for wound.  Neurological: Negative for weakness and numbness.    Physical Exam Updated Vital Signs BP 132/82    Pulse 62    Temp 97.8 F (36.6 C) (Oral)    Resp 16    Ht 5' 3.5" (1.613 m)    Wt 58.1 kg    SpO2 99%    BMI 22.32 kg/m   Physical Exam Vitals and nursing note reviewed.  Constitutional:      Appearance: She is not ill-appearing.  HENT:     Head: Normocephalic and atraumatic.  Eyes:     Conjunctiva/sclera: Conjunctivae normal.  Cardiovascular:     Rate and Rhythm: Normal rate and regular rhythm.     Pulses: Normal pulses.  Pulmonary:     Effort: Pulmonary effort is normal.     Breath sounds: Normal breath sounds. No wheezing, rhonchi or rales.  Musculoskeletal:     Comments: Left hand with ring on ring  finger; removed. Superficial puncture wound noted to the dorsal aspect of the left hand along the midshaft of the 3rd metacarpal; bleeding controlled. Does not extend to the palm. Mild TTP to the area. No tenderness to wrist and all digits. ROM intact throughout. 2+ radial pulse and cap refill < 2 seconds.   Skin:    General: Skin is warm and dry.     Coloration: Skin is not jaundiced.  Neurological:     Mental Status: She is alert.     ED Results / Procedures / Treatments   Labs (all labs ordered are listed, but only abnormal results are displayed) Labs Reviewed - No data to display  EKG None  Radiology DG Hand Complete Left  Result Date: 05/15/2020 CLINICAL DATA:  Dog bite EXAM: LEFT HAND - COMPLETE 3+ VIEW COMPARISON:  None. FINDINGS: Frontal, oblique, and lateral views were obtained. No fracture or dislocation. No radiopaque foreign body or soft tissue air. Soft tissue swelling over distal metacarpals. There is narrowing of the fifth PIP joint. Other joint spaces appear normal. No erosive change. IMPRESSION: No fracture or dislocation. No radiopaque foreign body. Mild soft tissue swelling dorsally. Narrowing fifth PIP joint. Other joint spaces appear normal. Electronically Signed   By: Lowella Grip III M.D.   On: 05/15/2020 12:26    Procedures Procedures (including critical care time)  Medications Ordered in ED Medications  rabies immune globulin (HYPERAB/KEDRAB) injection 1,125 Units (has no administration in time range)  rabies vaccine (RABAVERT) injection 1 mL (1 mL Intramuscular Given 05/15/20 1411)  Tdap (BOOSTRIX) injection 0.5 mL (0.5 mLs Intramuscular Given 05/15/20 1414)    ED Course  I have reviewed the triage vital signs and the nursing notes.  Pertinent labs & imaging results that were available during my care of the patient were reviewed by me and considered in my medical decision making (see chart for details).    MDM Rules/Calculators/A&P                           64 year old female who presents to the ED today after sustaining a dog bite to her left hand, dog appear to be a stray running in the neighborhood where patient was jogging.  She is unsure of the dog's rabies status and she is unsure of her tetanus status.  On arrival to the ED patient is afebrile, nontachycardic nontachypneic and appears to be  in no acute distress.  On exam she is wearing a ring on her left 4th digit, removed promptly prior to swelling occurring.  She has a superficial puncture wound to the dorsal aspect of her left hand, bleeding controlled.  It does not extend to the palmar aspect.  Given the dog appears to be a stray with unknown rabies status patient will need rabies IgG and vaccine today.  We will plan for x-ray at this time, tetanus updated and rabies.  Patient is in agreement with plan.   Xray without any foreign body or fractures. Pt to be discharged after she receives rabies and tetanus vaccines. She is discharged home with instructions for returning for Day 3, Day 7, and Day 14. Prescribed augmentin.  This note was prepared using Dragon voice recognition software and may include unintentional dictation errors due to the inherent limitations of voice recognition software.  Final Clinical Impression(s) / ED Diagnoses Final diagnoses:  Dog bite, initial encounter    Rx / DC Orders ED Discharge Orders         Ordered    amoxicillin-clavulanate (AUGMENTIN) 875-125 MG tablet  Every 12 hours        05/15/20 1412           Discharge Instructions     Please follow attached document on schedule of additional rabies vaccines. It is very important you get these done on the specific days indicated. Please follow up with [facility] as they should have the rabies vaccine. If not you can return to this ED or any other ED for treatment. You will however need to call around to local urgent cares in [place] to see if they carry this.   Pick up antibiotic  and take as prescribed to prevent infection  Return to the ED for any worsening symptoms       Eustaquio Maize, PA-C 05/15/20 1419    Daleen Bo, MD 05/16/20 [phone]

## 2020-05-15 NOTE — Discharge Instructions (Signed)
Please follow attached document on schedule of additional rabies vaccines. It is very important you get these done on the specific days indicated. Please follow up with [facility] as they should have the rabies vaccine. If not you can return to this ED or any other ED for treatment. You will however need to call around to local urgent cares in [place] to see if they carry this.   Pick up antibiotic and take as prescribed to prevent infection  Return to the ED for any worsening symptoms

## 2020-05-15 NOTE — ED Provider Notes (Signed)
  Face-to-face evaluation   History: She presents for evaluation of injury to left hand, which occurred when a stray dog bit her while she was running.  Physical exam: Left dorsum hand with 2 small punctures and mild associated bruising but no bleeding or discomfort with palpation or movement.  Medical screening examination/treatment/procedure(s) were conducted as a shared visit with non-physician practitioner(s) and myself.  I personally evaluated the patient during the encounter    Daleen Bo, MD 05/16/20 [phone]

## 2020-05-15 NOTE — ED Triage Notes (Signed)
Pt was jogging just PTA and was bit by unknown dog through her glove on posterior L hand.  2 puncture wounds noted with dried blood.  Unknown DT.  Denies pain.

## 2020-05-15 NOTE — ED Notes (Signed)
Patient verbalized understanding of discharge instructions. Opportunity for questions and answers.  

## 2020-05-18 ENCOUNTER — Ambulatory Visit (HOSPITAL_COMMUNITY)
Admission: RE | Admit: 2020-05-18 | Discharge: 2020-05-18 | Disposition: A | Discharge status: Home / Self Care | Payer: PPO | Source: Ambulatory Visit | Attending: Family Medicine | Admitting: Family Medicine

## 2020-05-18 ENCOUNTER — Other Ambulatory Visit: Payer: Self-pay

## 2020-05-18 DIAGNOSIS — Z203 Contact with and (suspected) exposure to rabies: Secondary | ICD-10-CM

## 2020-05-18 MED ORDER — RABIES VACCINE, PCEC IM SUSR
1.0000 mL | Freq: Once | INTRAMUSCULAR | Status: AC
  Administered 2020-05-18: 1 mL via INTRAMUSCULAR

## 2020-05-18 MED ORDER — RABIES VACCINE, PCEC IM SUSR
INTRAMUSCULAR | Status: AC
  Filled 2020-05-18: qty 1

## 2020-05-18 NOTE — ED Triage Notes (Signed)
Pt presents today for day 3 of rabies vaccine .

## 2020-05-21 DIAGNOSIS — R519 Headache, unspecified: Secondary | ICD-10-CM | POA: Diagnosis not present

## 2020-05-21 DIAGNOSIS — E039 Hypothyroidism, unspecified: Secondary | ICD-10-CM | POA: Diagnosis not present

## 2020-05-21 DIAGNOSIS — G8929 Other chronic pain: Secondary | ICD-10-CM | POA: Diagnosis not present

## 2020-05-22 ENCOUNTER — Ambulatory Visit (HOSPITAL_COMMUNITY)
Admission: EM | Admit: 2020-05-22 | Discharge: 2020-05-22 | Disposition: A | Discharge status: Home / Self Care | Payer: PPO | Attending: Family Medicine | Admitting: Family Medicine

## 2020-05-22 ENCOUNTER — Other Ambulatory Visit: Payer: Self-pay

## 2020-05-22 DIAGNOSIS — Z203 Contact with and (suspected) exposure to rabies: Secondary | ICD-10-CM

## 2020-05-22 MED ORDER — RABIES VACCINE, PCEC IM SUSR
1.0000 mL | Freq: Once | INTRAMUSCULAR | Status: AC
  Administered 2020-05-22: 1 mL via INTRAMUSCULAR

## 2020-05-22 MED ORDER — RABIES VACCINE, PCEC IM SUSR
INTRAMUSCULAR | Status: AC
  Filled 2020-05-22: qty 1

## 2020-05-22 NOTE — Discharge Instructions (Signed)
Return for final rabies injection May 29, 2020

## 2020-05-22 NOTE — ED Triage Notes (Signed)
Pt presents today for the 3rd vaccine of rabies.

## 2020-05-29 ENCOUNTER — Ambulatory Visit (HOSPITAL_COMMUNITY)
Admission: EM | Admit: 2020-05-29 | Discharge: 2020-05-29 | Disposition: A | Discharge status: Home / Self Care | Payer: PPO | Attending: Family Medicine | Admitting: Family Medicine

## 2020-05-29 ENCOUNTER — Ambulatory Visit (HOSPITAL_COMMUNITY): Payer: Self-pay

## 2020-05-29 ENCOUNTER — Other Ambulatory Visit: Payer: Self-pay

## 2020-05-29 DIAGNOSIS — Z203 Contact with and (suspected) exposure to rabies: Secondary | ICD-10-CM | POA: Diagnosis not present

## 2020-05-29 MED ORDER — RABIES VACCINE, PCEC IM SUSR
1.0000 mL | Freq: Once | INTRAMUSCULAR | Status: AC
  Administered 2020-05-29: 1 mL via INTRAMUSCULAR

## 2020-05-29 MED ORDER — RABIES VACCINE, PCEC IM SUSR
INTRAMUSCULAR | Status: AC
  Filled 2020-05-29: qty 1

## 2020-05-29 NOTE — ED Triage Notes (Addendum)
Pt came in today to receive her 4th rabies vaccine in her left deltoid

## 2020-10-17 DIAGNOSIS — Z20822 Contact with and (suspected) exposure to covid-19: Secondary | ICD-10-CM | POA: Diagnosis not present

## 2020-11-25 DIAGNOSIS — Z86018 Personal history of other benign neoplasm: Secondary | ICD-10-CM | POA: Diagnosis not present

## 2020-11-25 DIAGNOSIS — D225 Melanocytic nevi of trunk: Secondary | ICD-10-CM | POA: Diagnosis not present

## 2020-11-25 DIAGNOSIS — L814 Other melanin hyperpigmentation: Secondary | ICD-10-CM | POA: Diagnosis not present

## 2020-11-25 DIAGNOSIS — L821 Other seborrheic keratosis: Secondary | ICD-10-CM | POA: Diagnosis not present

## 2020-11-25 DIAGNOSIS — L578 Other skin changes due to chronic exposure to nonionizing radiation: Secondary | ICD-10-CM | POA: Diagnosis not present

## 2020-12-01 DIAGNOSIS — G8929 Other chronic pain: Secondary | ICD-10-CM | POA: Diagnosis not present

## 2020-12-01 DIAGNOSIS — E538 Deficiency of other specified B group vitamins: Secondary | ICD-10-CM | POA: Diagnosis not present

## 2020-12-01 DIAGNOSIS — E7800 Pure hypercholesterolemia, unspecified: Secondary | ICD-10-CM | POA: Diagnosis not present

## 2020-12-01 DIAGNOSIS — E559 Vitamin D deficiency, unspecified: Secondary | ICD-10-CM | POA: Diagnosis not present

## 2020-12-01 DIAGNOSIS — Z0000 Encounter for general adult medical examination without abnormal findings: Secondary | ICD-10-CM | POA: Diagnosis not present

## 2020-12-08 DIAGNOSIS — Z0000 Encounter for general adult medical examination without abnormal findings: Secondary | ICD-10-CM | POA: Diagnosis not present

## 2020-12-08 DIAGNOSIS — Z23 Encounter for immunization: Secondary | ICD-10-CM | POA: Diagnosis not present

## 2020-12-08 DIAGNOSIS — E039 Hypothyroidism, unspecified: Secondary | ICD-10-CM | POA: Diagnosis not present

## 2020-12-08 DIAGNOSIS — E7800 Pure hypercholesterolemia, unspecified: Secondary | ICD-10-CM | POA: Diagnosis not present

## 2020-12-08 DIAGNOSIS — E559 Vitamin D deficiency, unspecified: Secondary | ICD-10-CM | POA: Diagnosis not present

## 2020-12-08 DIAGNOSIS — E538 Deficiency of other specified B group vitamins: Secondary | ICD-10-CM | POA: Diagnosis not present

## 2020-12-10 ENCOUNTER — Other Ambulatory Visit: Payer: Self-pay | Admitting: Internal Medicine

## 2020-12-10 DIAGNOSIS — E7800 Pure hypercholesterolemia, unspecified: Secondary | ICD-10-CM

## 2021-01-14 ENCOUNTER — Ambulatory Visit
Admission: RE | Admit: 2021-01-14 | Discharge: 2021-01-14 | Disposition: A | Discharge status: Home / Self Care | Payer: PPO | Source: Ambulatory Visit | Attending: Internal Medicine | Admitting: Internal Medicine

## 2021-01-14 DIAGNOSIS — E785 Hyperlipidemia, unspecified: Secondary | ICD-10-CM | POA: Diagnosis not present

## 2021-01-14 DIAGNOSIS — E7800 Pure hypercholesterolemia, unspecified: Secondary | ICD-10-CM

## 2021-01-20 DIAGNOSIS — E039 Hypothyroidism, unspecified: Secondary | ICD-10-CM | POA: Diagnosis not present

## 2021-01-20 DIAGNOSIS — E559 Vitamin D deficiency, unspecified: Secondary | ICD-10-CM | POA: Diagnosis not present

## 2021-01-20 DIAGNOSIS — E538 Deficiency of other specified B group vitamins: Secondary | ICD-10-CM | POA: Diagnosis not present

## 2021-01-20 DIAGNOSIS — E7800 Pure hypercholesterolemia, unspecified: Secondary | ICD-10-CM | POA: Diagnosis not present

## 2021-03-09 DIAGNOSIS — E785 Hyperlipidemia, unspecified: Secondary | ICD-10-CM | POA: Diagnosis not present

## 2021-03-09 DIAGNOSIS — Z1231 Encounter for screening mammogram for malignant neoplasm of breast: Secondary | ICD-10-CM | POA: Diagnosis not present

## 2021-03-09 DIAGNOSIS — Z6822 Body mass index (BMI) 22.0-22.9, adult: Secondary | ICD-10-CM | POA: Diagnosis not present

## 2021-03-09 DIAGNOSIS — Z01419 Encounter for gynecological examination (general) (routine) without abnormal findings: Secondary | ICD-10-CM | POA: Diagnosis not present

## 2021-04-20 DIAGNOSIS — E7800 Pure hypercholesterolemia, unspecified: Secondary | ICD-10-CM | POA: Diagnosis not present

## 2021-04-20 DIAGNOSIS — E039 Hypothyroidism, unspecified: Secondary | ICD-10-CM | POA: Diagnosis not present

## 2021-05-02 DIAGNOSIS — E7800 Pure hypercholesterolemia, unspecified: Secondary | ICD-10-CM | POA: Diagnosis not present

## 2021-05-02 DIAGNOSIS — M79641 Pain in right hand: Secondary | ICD-10-CM | POA: Diagnosis not present

## 2021-05-02 DIAGNOSIS — M79643 Pain in unspecified hand: Secondary | ICD-10-CM | POA: Diagnosis not present

## 2021-05-02 DIAGNOSIS — M79642 Pain in left hand: Secondary | ICD-10-CM | POA: Diagnosis not present

## 2021-05-02 DIAGNOSIS — E039 Hypothyroidism, unspecified: Secondary | ICD-10-CM | POA: Diagnosis not present

## 2021-05-02 DIAGNOSIS — M2550 Pain in unspecified joint: Secondary | ICD-10-CM | POA: Diagnosis not present

## 2021-05-02 DIAGNOSIS — I10 Essential (primary) hypertension: Secondary | ICD-10-CM | POA: Diagnosis not present

## 2021-05-02 DIAGNOSIS — M79671 Pain in right foot: Secondary | ICD-10-CM | POA: Diagnosis not present

## 2021-05-02 DIAGNOSIS — R7982 Elevated C-reactive protein (CRP): Secondary | ICD-10-CM | POA: Diagnosis not present

## 2021-05-02 DIAGNOSIS — M1990 Unspecified osteoarthritis, unspecified site: Secondary | ICD-10-CM | POA: Diagnosis not present

## 2021-05-03 DIAGNOSIS — M2550 Pain in unspecified joint: Secondary | ICD-10-CM | POA: Diagnosis not present

## 2021-08-02 ENCOUNTER — Ambulatory Visit (HOSPITAL_BASED_OUTPATIENT_CLINIC_OR_DEPARTMENT_OTHER): Payer: PPO | Admitting: Internal Medicine

## 2021-08-04 DIAGNOSIS — M2550 Pain in unspecified joint: Secondary | ICD-10-CM | POA: Diagnosis not present

## 2021-08-04 DIAGNOSIS — M1990 Unspecified osteoarthritis, unspecified site: Secondary | ICD-10-CM | POA: Diagnosis not present

## 2021-08-04 DIAGNOSIS — M79643 Pain in unspecified hand: Secondary | ICD-10-CM | POA: Diagnosis not present

## 2021-08-04 DIAGNOSIS — I10 Essential (primary) hypertension: Secondary | ICD-10-CM | POA: Diagnosis not present

## 2021-08-04 DIAGNOSIS — R7982 Elevated C-reactive protein (CRP): Secondary | ICD-10-CM | POA: Diagnosis not present

## 2021-08-04 DIAGNOSIS — E039 Hypothyroidism, unspecified: Secondary | ICD-10-CM | POA: Diagnosis not present

## 2021-08-04 DIAGNOSIS — M79671 Pain in right foot: Secondary | ICD-10-CM | POA: Diagnosis not present

## 2021-08-04 DIAGNOSIS — E7800 Pure hypercholesterolemia, unspecified: Secondary | ICD-10-CM | POA: Diagnosis not present

## 2021-09-15 DIAGNOSIS — H04123 Dry eye syndrome of bilateral lacrimal glands: Secondary | ICD-10-CM | POA: Diagnosis not present

## 2021-09-15 DIAGNOSIS — H2513 Age-related nuclear cataract, bilateral: Secondary | ICD-10-CM | POA: Diagnosis not present

## 2021-09-15 DIAGNOSIS — H5203 Hypermetropia, bilateral: Secondary | ICD-10-CM | POA: Diagnosis not present

## 2021-09-15 DIAGNOSIS — H52203 Unspecified astigmatism, bilateral: Secondary | ICD-10-CM | POA: Diagnosis not present

## 2021-10-29 IMAGING — CT CT CARDIAC CORONARY ARTERY CALCIUM SCORE
3 series · 14 of 20 positions shown, 16 images · non-contrast
Comparison: None.

CLINICAL DATA: 64-year-old Caucasian female with history of
hyperlipidemia.

EXAM:
CT CARDIAC CORONARY ARTERY CALCIUM SCORE
TECHNIQUE: Non-contrast imaging through the heart was performed using
prospective ECG gating. Image post processing was performed on an
independent workstation, allowing for quantitative analysis of the
heart and coronary arteries. Note that this exam targets the heart
and the chest was not imaged in its entirety.

[Series 2: calcium scoring 2.00 qr36 bestdiast 70% hrt calciu · axial · 0.33mm/px · z∈[+1818,+1914]mm · 4 of 80 slices shown]
[im 16/80  vessel]
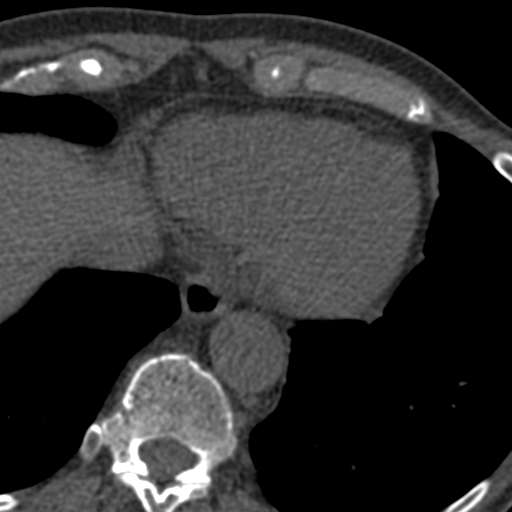
[im 32/80  vessel]
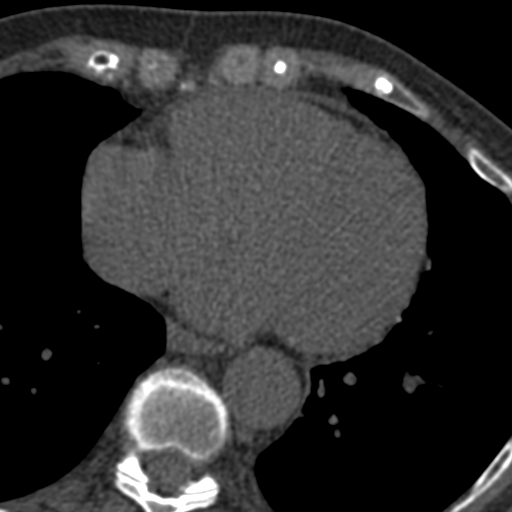
[im 48/80  vessel]
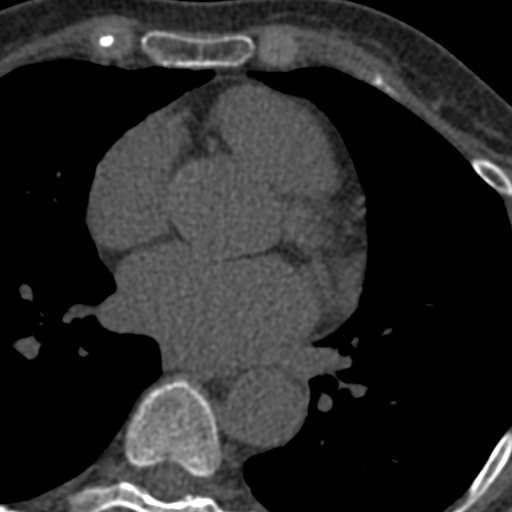
[im 64/80  vessel]
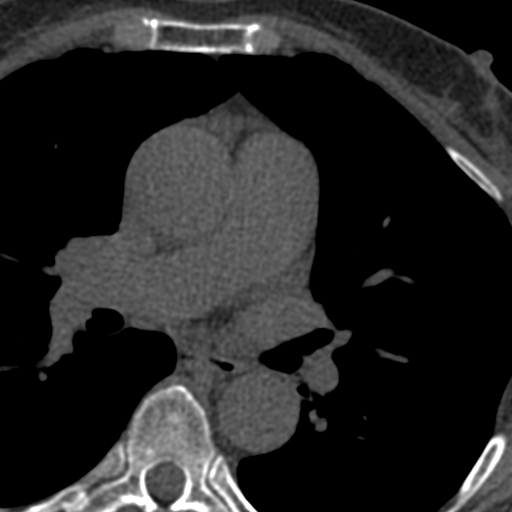

[Series 3: calcium scoring 2.00 br40 bestdiast 70% axial · axial · 0.44mm/px · z∈[+1814,+1918]mm · 5 of 80 slices shown, 7 images]
[im 14/80  vessel]
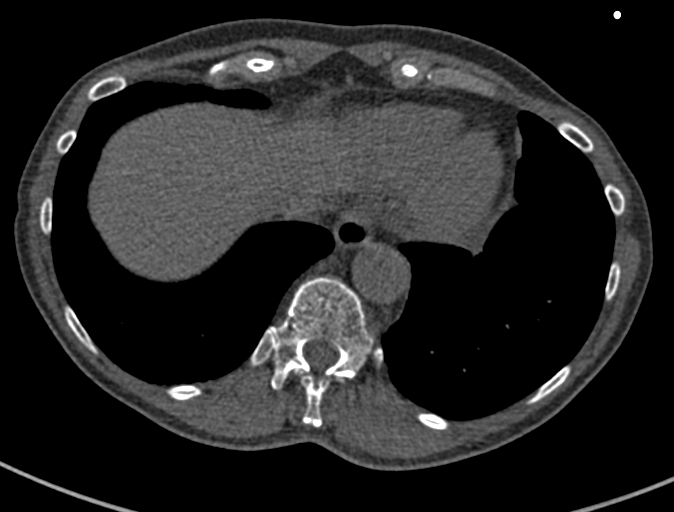
[im 14/80  lung]
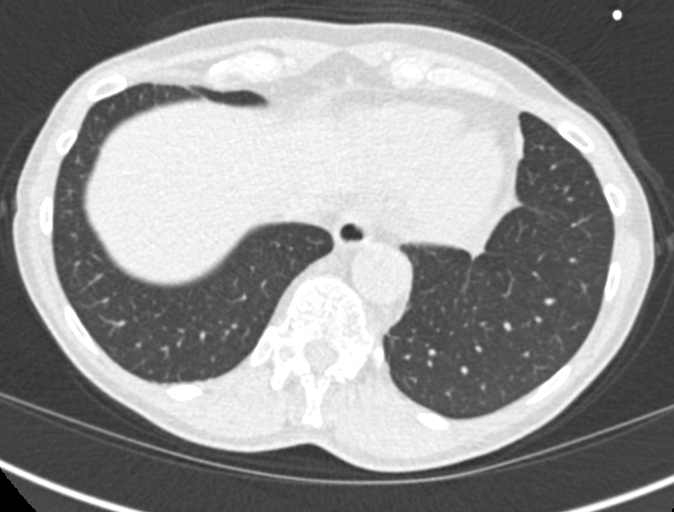
[im 27/80  vessel]
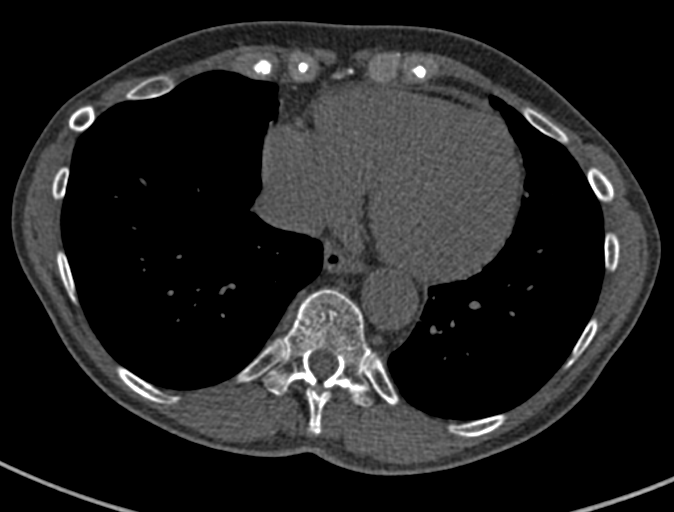
[im 40/80  vessel]
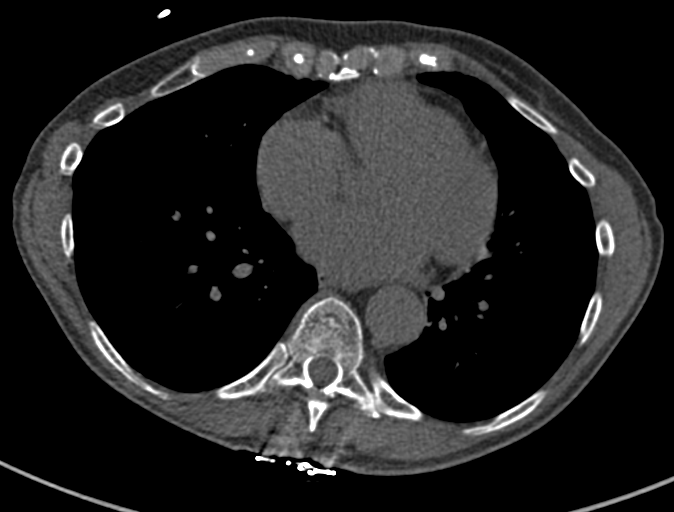
[im 53/80  vessel]
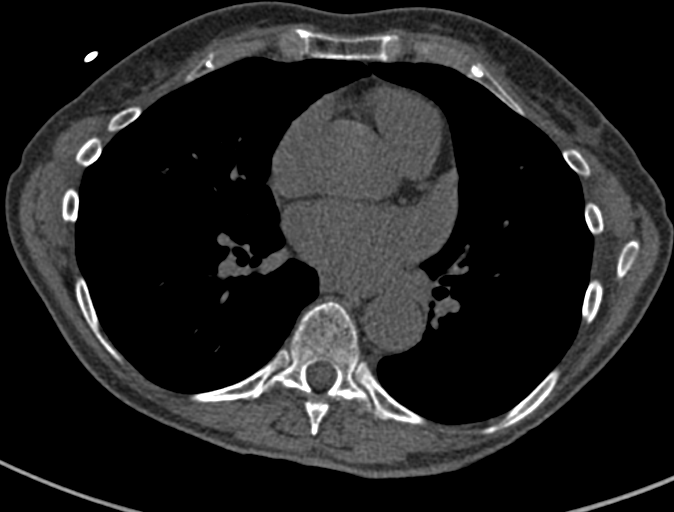
[im 66/80  vessel]
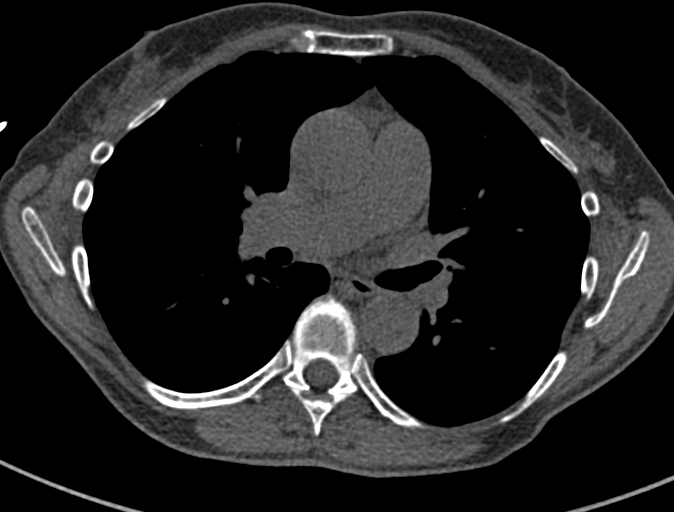
[im 66/80  lung]
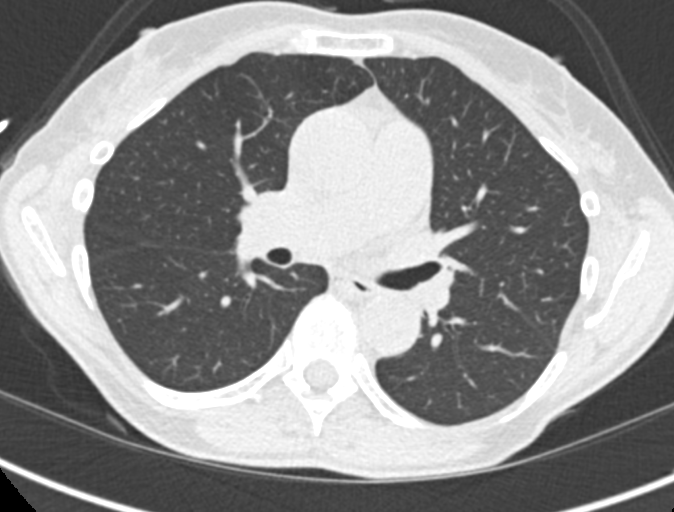

[Series 9: calcium scoring 2.00 br60 bestdiast 70% lungs · axial · 0.44mm/px · z∈[+1814,+1918]mm · 5 of 80 slices shown]
[im 14/80  vessel]
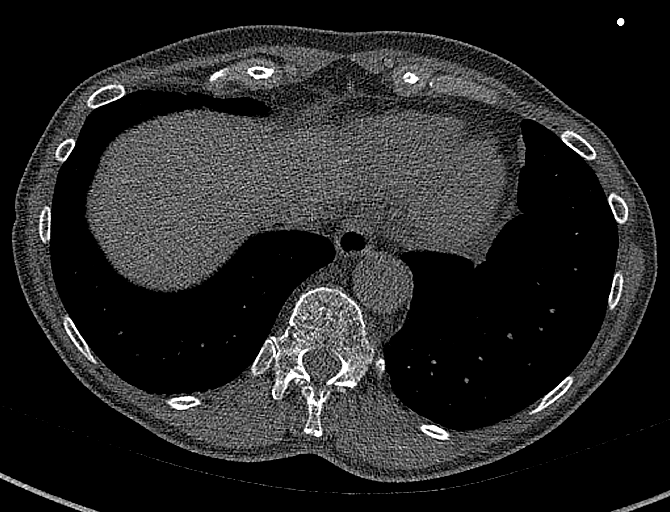
[im 27/80  vessel]
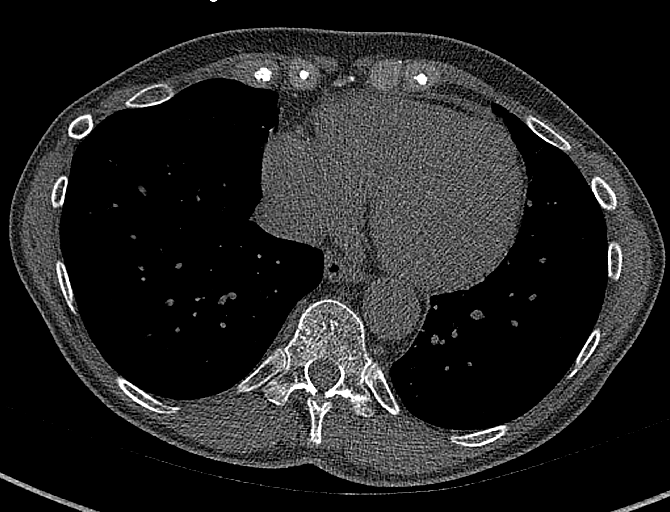
[im 40/80  vessel]
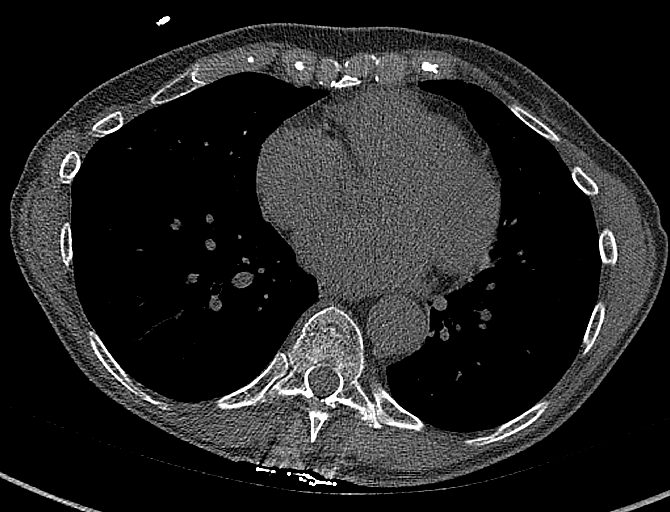
[im 53/80  vessel]
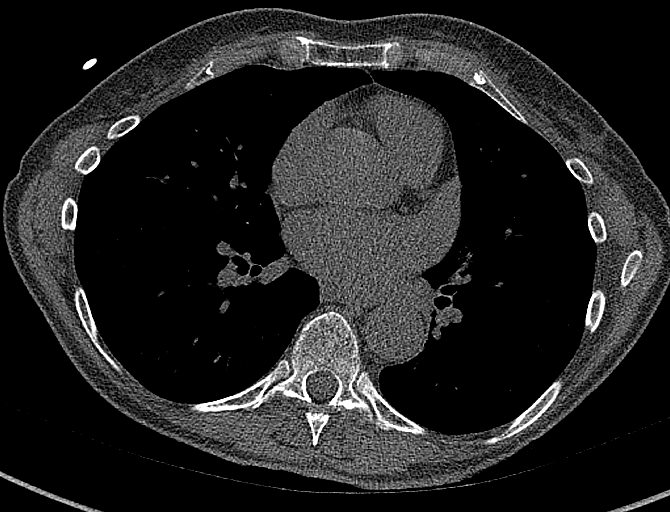
[im 66/80  vessel]
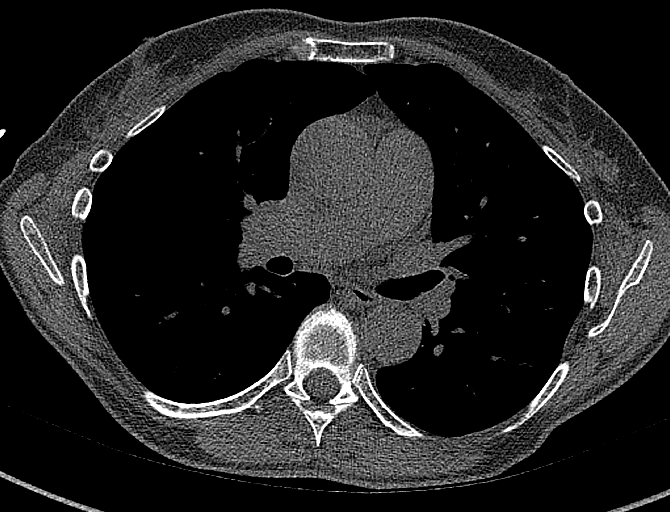

[14 of 20 positions shown; findings below may reference images not displayed]

FINDINGS: CORONARY CALCIUM SCORES:

Left Main: 0

LAD: 0

LCx: 0

RCA: 0

Total Agatston Score: 0

[HOSPITAL] percentile: 0

AORTA MEASUREMENTS:

Ascending Aorta: 35 mm

Descending Aorta: 26 mm

OTHER FINDINGS:


## 2021-11-29 DIAGNOSIS — L820 Inflamed seborrheic keratosis: Secondary | ICD-10-CM | POA: Diagnosis not present

## 2021-11-29 DIAGNOSIS — Z86018 Personal history of other benign neoplasm: Secondary | ICD-10-CM | POA: Diagnosis not present

## 2021-11-29 DIAGNOSIS — L578 Other skin changes due to chronic exposure to nonionizing radiation: Secondary | ICD-10-CM | POA: Diagnosis not present

## 2021-11-29 DIAGNOSIS — L821 Other seborrheic keratosis: Secondary | ICD-10-CM | POA: Diagnosis not present

## 2021-11-29 DIAGNOSIS — D225 Melanocytic nevi of trunk: Secondary | ICD-10-CM | POA: Diagnosis not present

## 2021-11-29 DIAGNOSIS — L814 Other melanin hyperpigmentation: Secondary | ICD-10-CM | POA: Diagnosis not present

## 2021-12-01 DIAGNOSIS — G43009 Migraine without aura, not intractable, without status migrainosus: Secondary | ICD-10-CM | POA: Diagnosis not present

## 2021-12-16 ENCOUNTER — Ambulatory Visit (HOSPITAL_BASED_OUTPATIENT_CLINIC_OR_DEPARTMENT_OTHER): Payer: PPO | Admitting: Internal Medicine

## 2021-12-16 ENCOUNTER — Encounter (HOSPITAL_BASED_OUTPATIENT_CLINIC_OR_DEPARTMENT_OTHER): Payer: Self-pay

## 2022-01-18 DIAGNOSIS — J302 Other seasonal allergic rhinitis: Secondary | ICD-10-CM | POA: Diagnosis not present

## 2022-01-18 DIAGNOSIS — E039 Hypothyroidism, unspecified: Secondary | ICD-10-CM | POA: Diagnosis not present

## 2022-01-18 DIAGNOSIS — G43009 Migraine without aura, not intractable, without status migrainosus: Secondary | ICD-10-CM | POA: Diagnosis not present

## 2022-01-18 DIAGNOSIS — E559 Vitamin D deficiency, unspecified: Secondary | ICD-10-CM | POA: Diagnosis not present

## 2022-01-18 DIAGNOSIS — M1991 Primary osteoarthritis, unspecified site: Secondary | ICD-10-CM | POA: Diagnosis not present

## 2022-02-07 DIAGNOSIS — Z1211 Encounter for screening for malignant neoplasm of colon: Secondary | ICD-10-CM | POA: Diagnosis not present

## 2022-02-07 DIAGNOSIS — M8580 Other specified disorders of bone density and structure, unspecified site: Secondary | ICD-10-CM | POA: Diagnosis not present

## 2022-02-07 DIAGNOSIS — E039 Hypothyroidism, unspecified: Secondary | ICD-10-CM | POA: Diagnosis not present

## 2022-02-07 DIAGNOSIS — E7800 Pure hypercholesterolemia, unspecified: Secondary | ICD-10-CM | POA: Diagnosis not present

## 2022-02-07 DIAGNOSIS — Z0000 Encounter for general adult medical examination without abnormal findings: Secondary | ICD-10-CM | POA: Diagnosis not present

## 2022-02-07 DIAGNOSIS — E559 Vitamin D deficiency, unspecified: Secondary | ICD-10-CM | POA: Diagnosis not present

## 2022-02-07 DIAGNOSIS — G43009 Migraine without aura, not intractable, without status migrainosus: Secondary | ICD-10-CM | POA: Diagnosis not present

## 2022-02-21 DIAGNOSIS — E039 Hypothyroidism, unspecified: Secondary | ICD-10-CM | POA: Diagnosis not present

## 2022-02-21 DIAGNOSIS — E7800 Pure hypercholesterolemia, unspecified: Secondary | ICD-10-CM | POA: Diagnosis not present

## 2022-03-15 DIAGNOSIS — E7800 Pure hypercholesterolemia, unspecified: Secondary | ICD-10-CM | POA: Diagnosis not present

## 2022-03-15 DIAGNOSIS — E039 Hypothyroidism, unspecified: Secondary | ICD-10-CM | POA: Diagnosis not present

## 2022-04-12 DIAGNOSIS — Z124 Encounter for screening for malignant neoplasm of cervix: Secondary | ICD-10-CM | POA: Diagnosis not present

## 2022-04-12 DIAGNOSIS — Z1231 Encounter for screening mammogram for malignant neoplasm of breast: Secondary | ICD-10-CM | POA: Diagnosis not present

## 2022-04-12 DIAGNOSIS — R6882 Decreased libido: Secondary | ICD-10-CM | POA: Diagnosis not present

## 2022-04-12 DIAGNOSIS — N952 Postmenopausal atrophic vaginitis: Secondary | ICD-10-CM | POA: Diagnosis not present

## 2022-04-12 DIAGNOSIS — Z6822 Body mass index (BMI) 22.0-22.9, adult: Secondary | ICD-10-CM | POA: Diagnosis not present

## 2022-06-21 DIAGNOSIS — R051 Acute cough: Secondary | ICD-10-CM | POA: Diagnosis not present

## 2022-06-21 DIAGNOSIS — J40 Bronchitis, not specified as acute or chronic: Secondary | ICD-10-CM | POA: Diagnosis not present

## 2022-07-06 DIAGNOSIS — H04123 Dry eye syndrome of bilateral lacrimal glands: Secondary | ICD-10-CM | POA: Diagnosis not present

## 2022-07-06 DIAGNOSIS — H10413 Chronic giant papillary conjunctivitis, bilateral: Secondary | ICD-10-CM | POA: Diagnosis not present

## 2022-07-20 DIAGNOSIS — H16223 Keratoconjunctivitis sicca, not specified as Sjogren's, bilateral: Secondary | ICD-10-CM | POA: Diagnosis not present

## 2022-08-23 DIAGNOSIS — E7800 Pure hypercholesterolemia, unspecified: Secondary | ICD-10-CM | POA: Diagnosis not present

## 2022-11-21 DIAGNOSIS — E039 Hypothyroidism, unspecified: Secondary | ICD-10-CM | POA: Diagnosis not present

## 2022-11-21 DIAGNOSIS — R5383 Other fatigue: Secondary | ICD-10-CM | POA: Diagnosis not present

## 2022-11-21 DIAGNOSIS — R350 Frequency of micturition: Secondary | ICD-10-CM | POA: Diagnosis not present

## 2022-11-21 DIAGNOSIS — F419 Anxiety disorder, unspecified: Secondary | ICD-10-CM | POA: Diagnosis not present

## 2022-11-21 DIAGNOSIS — E7800 Pure hypercholesterolemia, unspecified: Secondary | ICD-10-CM | POA: Diagnosis not present

## 2022-11-22 DIAGNOSIS — R5383 Other fatigue: Secondary | ICD-10-CM | POA: Diagnosis not present

## 2022-11-22 DIAGNOSIS — E039 Hypothyroidism, unspecified: Secondary | ICD-10-CM | POA: Diagnosis not present

## 2022-11-22 DIAGNOSIS — R350 Frequency of micturition: Secondary | ICD-10-CM | POA: Diagnosis not present

## 2022-11-22 DIAGNOSIS — E7800 Pure hypercholesterolemia, unspecified: Secondary | ICD-10-CM | POA: Diagnosis not present

## 2023-02-09 DIAGNOSIS — E559 Vitamin D deficiency, unspecified: Secondary | ICD-10-CM | POA: Diagnosis not present

## 2023-02-09 DIAGNOSIS — Z79899 Other long term (current) drug therapy: Secondary | ICD-10-CM | POA: Diagnosis not present

## 2023-02-09 DIAGNOSIS — E039 Hypothyroidism, unspecified: Secondary | ICD-10-CM | POA: Diagnosis not present

## 2023-02-09 DIAGNOSIS — M8580 Other specified disorders of bone density and structure, unspecified site: Secondary | ICD-10-CM | POA: Diagnosis not present

## 2023-02-09 DIAGNOSIS — E7800 Pure hypercholesterolemia, unspecified: Secondary | ICD-10-CM | POA: Diagnosis not present

## 2023-02-09 DIAGNOSIS — Z0000 Encounter for general adult medical examination without abnormal findings: Secondary | ICD-10-CM | POA: Diagnosis not present

## 2023-02-09 DIAGNOSIS — F419 Anxiety disorder, unspecified: Secondary | ICD-10-CM | POA: Diagnosis not present

## 2023-02-14 DIAGNOSIS — E7800 Pure hypercholesterolemia, unspecified: Secondary | ICD-10-CM | POA: Diagnosis not present

## 2023-02-14 DIAGNOSIS — Z79899 Other long term (current) drug therapy: Secondary | ICD-10-CM | POA: Diagnosis not present

## 2023-02-14 DIAGNOSIS — E559 Vitamin D deficiency, unspecified: Secondary | ICD-10-CM | POA: Diagnosis not present

## 2023-05-10 DIAGNOSIS — Z6822 Body mass index (BMI) 22.0-22.9, adult: Secondary | ICD-10-CM | POA: Diagnosis not present

## 2023-05-10 DIAGNOSIS — Z1231 Encounter for screening mammogram for malignant neoplasm of breast: Secondary | ICD-10-CM | POA: Diagnosis not present

## 2023-05-10 DIAGNOSIS — Z01419 Encounter for gynecological examination (general) (routine) without abnormal findings: Secondary | ICD-10-CM | POA: Diagnosis not present

## 2023-10-19 DIAGNOSIS — R42 Dizziness and giddiness: Secondary | ICD-10-CM | POA: Diagnosis not present

## 2024-01-10 DIAGNOSIS — R300 Dysuria: Secondary | ICD-10-CM | POA: Diagnosis not present

## 2024-01-18 DIAGNOSIS — R300 Dysuria: Secondary | ICD-10-CM | POA: Diagnosis not present

## 2024-01-29 DIAGNOSIS — R300 Dysuria: Secondary | ICD-10-CM | POA: Diagnosis not present

## 2024-01-29 DIAGNOSIS — N390 Urinary tract infection, site not specified: Secondary | ICD-10-CM | POA: Diagnosis not present

## 2024-02-13 DIAGNOSIS — N390 Urinary tract infection, site not specified: Secondary | ICD-10-CM | POA: Diagnosis not present

## 2024-02-13 DIAGNOSIS — Z0000 Encounter for general adult medical examination without abnormal findings: Secondary | ICD-10-CM | POA: Diagnosis not present

## 2024-02-13 DIAGNOSIS — Z79899 Other long term (current) drug therapy: Secondary | ICD-10-CM | POA: Diagnosis not present

## 2024-02-13 DIAGNOSIS — E039 Hypothyroidism, unspecified: Secondary | ICD-10-CM | POA: Diagnosis not present

## 2024-02-13 DIAGNOSIS — Z1331 Encounter for screening for depression: Secondary | ICD-10-CM | POA: Diagnosis not present

## 2024-02-13 DIAGNOSIS — Z1211 Encounter for screening for malignant neoplasm of colon: Secondary | ICD-10-CM | POA: Diagnosis not present

## 2024-02-13 DIAGNOSIS — M8580 Other specified disorders of bone density and structure, unspecified site: Secondary | ICD-10-CM | POA: Diagnosis not present

## 2024-02-13 DIAGNOSIS — E559 Vitamin D deficiency, unspecified: Secondary | ICD-10-CM | POA: Diagnosis not present

## 2024-02-13 DIAGNOSIS — E7800 Pure hypercholesterolemia, unspecified: Secondary | ICD-10-CM | POA: Diagnosis not present

## 2024-02-19 DIAGNOSIS — Z0000 Encounter for general adult medical examination without abnormal findings: Secondary | ICD-10-CM | POA: Diagnosis not present

## 2024-02-19 DIAGNOSIS — E039 Hypothyroidism, unspecified: Secondary | ICD-10-CM | POA: Diagnosis not present

## 2024-02-19 DIAGNOSIS — E7800 Pure hypercholesterolemia, unspecified: Secondary | ICD-10-CM | POA: Diagnosis not present

## 2024-02-19 DIAGNOSIS — E559 Vitamin D deficiency, unspecified: Secondary | ICD-10-CM | POA: Diagnosis not present

## 2024-02-25 DIAGNOSIS — Z1212 Encounter for screening for malignant neoplasm of rectum: Secondary | ICD-10-CM | POA: Diagnosis not present

## 2024-02-25 DIAGNOSIS — Z1211 Encounter for screening for malignant neoplasm of colon: Secondary | ICD-10-CM | POA: Diagnosis not present

## 2024-02-26 DIAGNOSIS — L814 Other melanin hyperpigmentation: Secondary | ICD-10-CM | POA: Diagnosis not present

## 2024-02-26 DIAGNOSIS — L820 Inflamed seborrheic keratosis: Secondary | ICD-10-CM | POA: Diagnosis not present

## 2024-02-26 DIAGNOSIS — Z86018 Personal history of other benign neoplasm: Secondary | ICD-10-CM | POA: Diagnosis not present

## 2024-02-26 DIAGNOSIS — L821 Other seborrheic keratosis: Secondary | ICD-10-CM | POA: Diagnosis not present

## 2024-02-26 DIAGNOSIS — D225 Melanocytic nevi of trunk: Secondary | ICD-10-CM | POA: Diagnosis not present

## 2024-02-26 DIAGNOSIS — L578 Other skin changes due to chronic exposure to nonionizing radiation: Secondary | ICD-10-CM | POA: Diagnosis not present
